# Patient Record
Sex: Male | Born: 1965 | Race: White | Hispanic: No | Marital: Married | Smoking: Never smoker
Health system: Southern US, Community
[De-identification: ages and names within clinical notes are randomized; demographics above are authoritative.]

---

## 2004-12-15 ENCOUNTER — Emergency Department: Payer: Self-pay | Admitting: Emergency Medicine

## 2014-02-18 ENCOUNTER — Emergency Department: Payer: Self-pay | Admitting: Emergency Medicine

## 2014-02-18 LAB — COMPREHENSIVE METABOLIC PANEL
ALK PHOS: 96 U/L
Albumin: 3.9 g/dL (ref 3.4–5.0)
Anion Gap: 5 — ABNORMAL LOW (ref 7–16)
BILIRUBIN TOTAL: 0.5 mg/dL (ref 0.2–1.0)
BUN: 10 mg/dL (ref 7–18)
CHLORIDE: 102 mmol/L (ref 98–107)
CREATININE: 0.91 mg/dL (ref 0.60–1.30)
Calcium, Total: 9.3 mg/dL (ref 8.5–10.1)
Co2: 30 mmol/L (ref 21–32)
EGFR (African American): >60
EGFR (Non-African Amer.): >60
Glucose: 105 mg/dL — ABNORMAL HIGH (ref 65–99)
OSMOLALITY: 273 (ref 275–301)
Potassium: 3.8 mmol/L (ref 3.5–5.1)
SGOT(AST): 25 U/L (ref 15–37)
SGPT (ALT): 36 U/L
Sodium: 137 mmol/L (ref 136–145)
Total Protein: 8.5 g/dL — ABNORMAL HIGH (ref 6.4–8.2)

## 2014-02-18 LAB — URINALYSIS, COMPLETE
Bacteria: NONE SEEN
Bilirubin,UR: NEGATIVE
GLUCOSE, UR: NEGATIVE mg/dL (ref 0–75)
Ketone: NEGATIVE
Leukocyte Esterase: NEGATIVE
NITRITE: NEGATIVE
PH: 5 (ref 4.5–8.0)
Protein: NEGATIVE
SPECIFIC GRAVITY: 1.016 (ref 1.003–1.030)
Squamous Epithelial: 1
WBC UR: <1 /HPF (ref 0–5)

## 2014-02-18 LAB — CBC
HCT: 43.8 % (ref 40.0–52.0)
HGB: 14.2 g/dL (ref 13.0–18.0)
MCH: 27.3 pg (ref 26.0–34.0)
MCHC: 32.5 g/dL (ref 32.0–36.0)
MCV: 84 fL (ref 80–100)
Platelet: 325 10*3/uL (ref 150–440)
RBC: 5.22 10*6/uL (ref 4.40–5.90)
RDW: 14 % (ref 11.5–14.5)
WBC: 12.4 10*3/uL — AB (ref 3.8–10.6)

## 2014-02-18 LAB — TROPONIN I: Troponin-I: <0.02 ng/mL

## 2014-02-18 LAB — LIPASE, BLOOD: Lipase: 87 U/L (ref 73–393)

## 2014-02-20 ENCOUNTER — Ambulatory Visit: Payer: Self-pay | Admitting: Urology

## 2014-03-03 ENCOUNTER — Ambulatory Visit: Payer: Self-pay | Admitting: Obstetrics and Gynecology

## 2014-03-09 ENCOUNTER — Ambulatory Visit: Payer: Self-pay | Admitting: Urology

## 2014-03-09 LAB — URINALYSIS, COMPLETE
BILIRUBIN, UR: NEGATIVE
Bacteria: NONE SEEN
GLUCOSE, UR: NEGATIVE mg/dL (ref 0–75)
KETONE: NEGATIVE
Nitrite: NEGATIVE
PH: 5 (ref 4.5–8.0)
Protein: 100
SPECIFIC GRAVITY: 1.018 (ref 1.003–1.030)
Squamous Epithelial: NONE SEEN
WBC UR: 34 /HPF (ref 0–5)

## 2014-03-09 LAB — BASIC METABOLIC PANEL
Anion Gap: 6 — ABNORMAL LOW (ref 7–16)
BUN: 15 mg/dL (ref 7–18)
CREATININE: 0.94 mg/dL (ref 0.60–1.30)
Calcium, Total: 9.3 mg/dL (ref 8.5–10.1)
Chloride: 102 mmol/L (ref 98–107)
Co2: 29 mmol/L (ref 21–32)
EGFR (African American): >60
EGFR (Non-African Amer.): >60
Glucose: 117 mg/dL — ABNORMAL HIGH (ref 65–99)
Osmolality: 276 (ref 275–301)
POTASSIUM: 4.2 mmol/L (ref 3.5–5.1)
Sodium: 137 mmol/L (ref 136–145)

## 2014-03-09 LAB — PROTIME-INR
INR: 1
Prothrombin Time: 12.9 secs (ref 11.5–14.7)

## 2014-03-09 LAB — APTT: Activated PTT: 31.5 secs (ref 23.6–35.9)

## 2014-03-10 LAB — URINE CULTURE

## 2014-03-11 ENCOUNTER — Ambulatory Visit: Payer: Self-pay | Admitting: Urology

## 2014-04-16 ENCOUNTER — Ambulatory Visit: Payer: Self-pay | Admitting: Urology

## 2014-10-31 NOTE — Op Note (Signed)
PATIENT NAME:  Eduardo Jimenez, Eduardo Jimenez MR#:  [id number] DATE OF BIRTH:  08/23/1965  DATE OF PROCEDURE:  02/20/2014  PREOPERATIVE DIAGNOSIS: Left ureteropelvic junction obstruction.   POSTOPERATIVE DIAGNOSIS: Left ureteropelvic junction obstruction.   PROCEDURE: Cystoscopy with retrograde stent.  SURGEON: Pearce Littlefield D. Edwyna ShellHart, DO.  ANESTHESIA: General.  DESCRIPTION OF PROCEDURE: With the patient sterilely prepped and draped and after an appropriate timeout, I do a left retrograde. I only see one ureteral orifice on the left side. So, I instrument this orifice, and do a retrograde. I see no partial duplication on the retrograde. There is a tightness at the UPJ. So, I am able to bypass this with a 0.036 Glidewire, and over the 0.036 Glidewire, I place the 28 cm, 6 French stent and easily goes up into the small pelvis. It is really hard to tell whether I am instrumented into hydronephrotic segment. However, I cannot see any duplication at this point. So, I am assuming that I bypassed the stone as it was tight where I put the wire and the stent. The stent is in good position by cystoscopy in the distal portion as it is in the bladder, I make sure it is curled in the bladder and curled up in the kidney.   The patient tolerated this procedure well. He is sent to recovery in satisfactory condition with 30 mL of 0.5% Marcaine in is bladder and a belladonna and opium suppository. The patient had instrumentation with a 5 JamaicaFrench open-ended catheter for the retrograde.    ____________________________ Caralyn Guileichard D. Edwyna ShellHart, DO rdh:NT D: 02/20/2014 11:30:00 ET T: 02/20/2014 13:48:03 ET JOB#: [id number]  cc: Caralyn Guileichard D. Edwyna ShellHart, DO, <Dictator> Kinta Martis D Kagan Mutchler DO ELECTRONICALLY SIGNED 02/20/2014 15:27

## 2014-10-31 NOTE — Op Note (Signed)
PATIENT NAME:  Eduardo Jimenez, Eduardo Jimenez MR#:  [id number] DATE OF BIRTH:  [birth year]  DATE OF PROCEDURE:  03/11/2014  PREOPERATIVE DIAGNOSES:  Left proximal ureteral stone, left ureteropelvic junction obstruction.    POSTOPERATIVE DIAGNOSES:  Left proximal ureteral stone, left ureteropelvic junction obstruction.    PROCEDURE PERFORMED:  Left ureteroscopy, laser lithotripsy, left ureteral stent exchange, interpretation of fluoroscopy less than 30 minutes.    ATTENDING SURGEON:  Claris GladdenAshley J. Suzanna Zahn, MD    ANESTHESIA:  General anesthesia.    ESTIMATED BLOOD LOSS:  Minimal.    DRAINS:  None.    COMPLICATIONS:  None.    SPECIMENS:  Stone (given to patient postoperatively).    INDICATION:  This is a 49 year old male with a history of nephrolithiasis who previously underwent left ureteral stent placement for a 3 mm left proximal ureteral stone at the level of a known left UPJ obstruction.  He returns today to the operating room for definitive management of his stone.  Risk and benefits of procedure were explained in detail to patient who agreed to proceed as planned.    PROCEDURE:  The patient was correctly identified in the preoperative holding area and informed consent was obtained.  He was brought to the operating suite and placed on the table in the supine position.  At this time, a universal time-out protocol was performed.  All team members were identified.  Venodyne boots were placed and he was administered 3 grams of IV Ancef in the preoperative period.  He was then placed under general anesthesia and repositioned in the dorsal-lithotomy position.  He was prepped and draped in the standard surgical fashion.    A rigid cystoscope was then introduced per urethra into the bladder without difficulty.  The bladder was surveilled.  There was no bladder pathology noted.  The left ureteral stent could be seen emanating from the left UO.  At this time, a stent grasper was used to grasp the tip of the coil  off the stent, which was brought out per urethral meatus.  The stent was then cannulated using a 0.038 sensor wire up to the level of the kidney confirmed under fluoroscopic guidance.  The stent was then removed leaving the wire in place.  A dual-lumen access sheath was advanced into the level of the distal ureter and a second sensor wire was introduced.  One wire was snapped in place used as a safety wire and the second was used as a working wire.    A flexible ureteroscope was then advanced over this level up to the level of the UPJ.  The UPJ was inspected and there was no evidence of stone.  At this point in time formal pyeloscopy was performed, and this revealed a small stone in the lower mid pole calyx.  The stone was quite small and grasped using a Zero Tip Nitinol basket and able to be brought out through the ureter without fragmentation.  The dual-lumen was again used to introduce a second wire, and the wire was placed up to the level of the renal pelvis.  A second stone fragment was then identified, this one significantly larger, and it was partially fragmented using a 200 micron laser fiber using the settings of 0.8 joules and 12 hertz.  Once fragmented, each small piece was grasped using a tipless Nitinol basket and able to be brought out through the ureter without difficulty.  The same procedure was used to reintroduce the scope up to the level of the renal pelvis,  which was carefully inspected including all calyces, both under direct visualization and under fluoroscopic guidance.  There were no remaining stone fragments throughout the entire kidney.    The scope was then removed.  The remaining safety wire was then backloaded over a rigid cystoscope and a 6 x 26 [place] double J ureteral stent was advanced to the level of the renal pelvis.  The wire was partially withdrawn until a coil was noted within the renal pelvis.  The wire was then fully withdrawn and a coil was noted within the bladder.     The bladder was then drained and removed.  The patient was then returned to the supine position, reversed from anesthesia and taken to the PACU in stable condition.  There were no complications in this case.    ____________________________ Claris Gladden, MD ajb:aw D: 03/11/2014 19:09:54 ET T: 03/12/2014 02:02:17 ET JOB#: [id number]  cc: Claris Gladden, MD, <Dictator> Claris Gladden MD ELECTRONICALLY SIGNED 04/19/2014 8:18

## 2014-12-28 ENCOUNTER — Other Ambulatory Visit: Payer: Self-pay | Admitting: Family Medicine

## 2014-12-28 DIAGNOSIS — N200 Calculus of kidney: Secondary | ICD-10-CM

## 2016-06-11 IMAGING — CT CT STONE STUDY
2 of 4 series · 16 of 46 positions shown, 18 images · non-contrast
Comparison: None.

CLINICAL DATA: Left flank pain with a history of kidney stones.

EXAM:
CT ABDOMEN AND PELVIS WITHOUT CONTRAST
TECHNIQUE: Multidetector CT imaging of the abdomen and pelvis was performed
following the standard protocol without IV contrast.

[Series 2: stone standard full · axial · 0.82mm/px · z∈[-545,-85]mm · 13 of 102 slices shown, 15 images]
[im 5/102  soft-tissue]
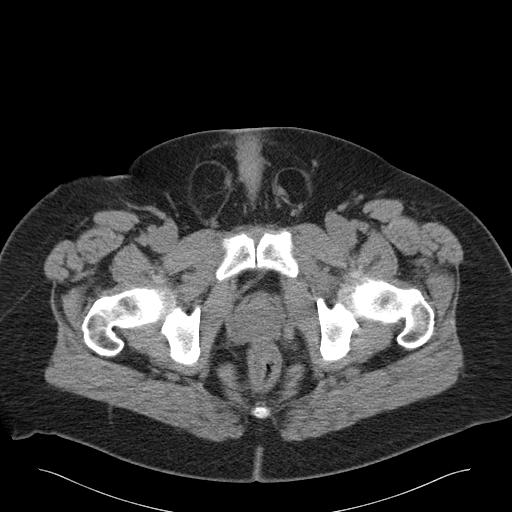
[im 5/102  bone]
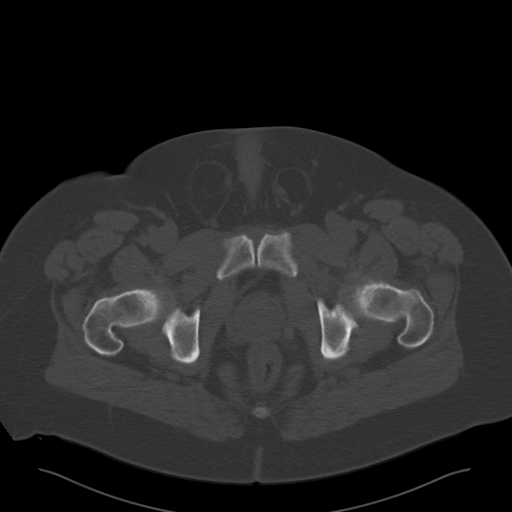
[im 13/102  soft-tissue]
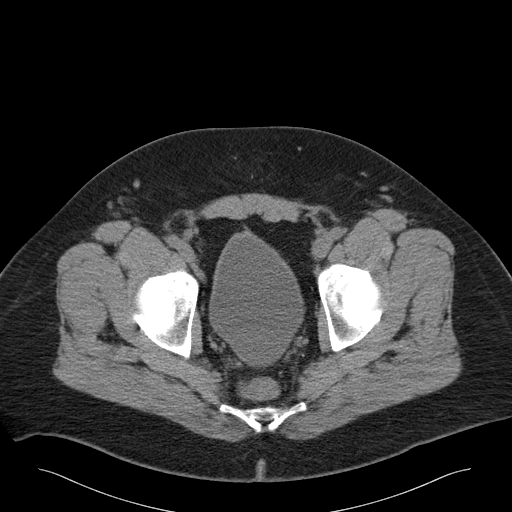
[im 22/102  soft-tissue]
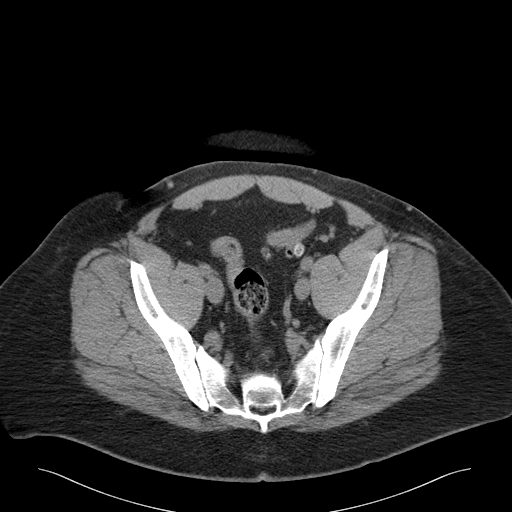
[im 30/102  soft-tissue]
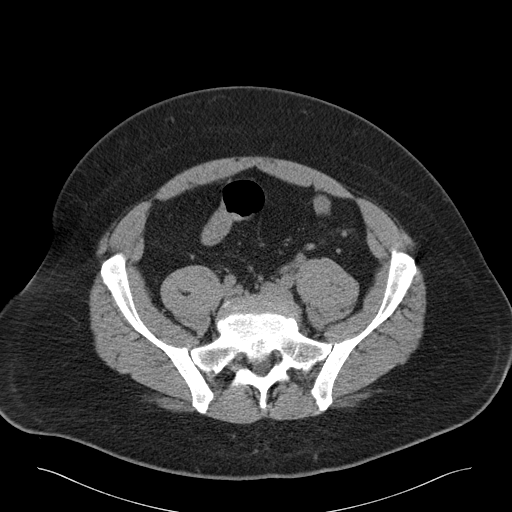
[im 34/102  soft-tissue]
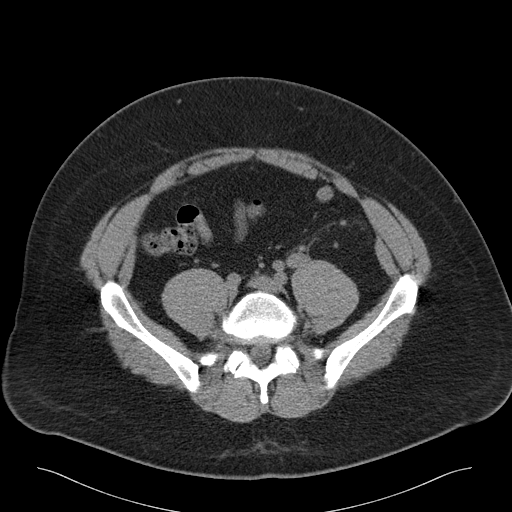
[im 43/102  soft-tissue]
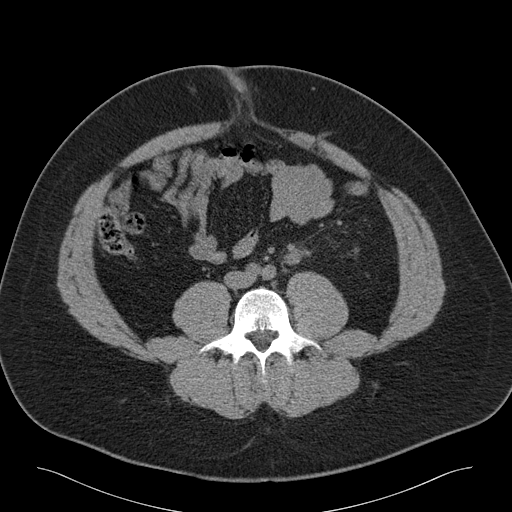
[im 51/102  soft-tissue]
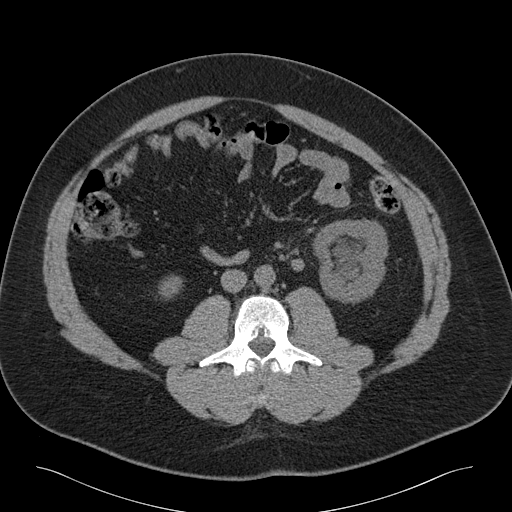
[im 59/102  soft-tissue]
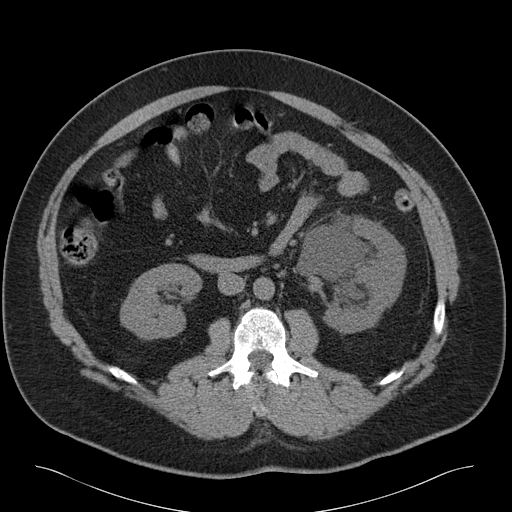
[im 68/102  soft-tissue]
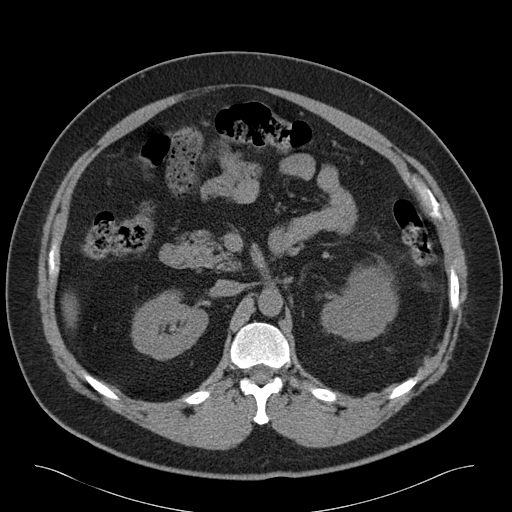
[im 68/102  bone]
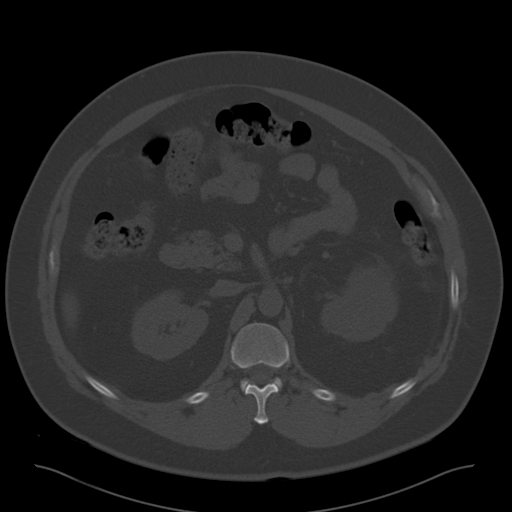
[im 72/102  soft-tissue]
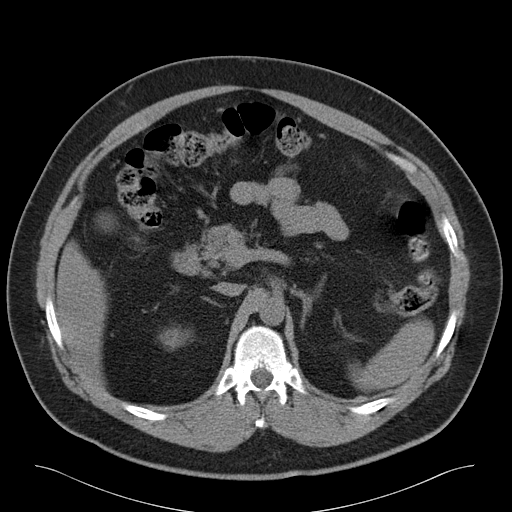
[im 80/102  soft-tissue]
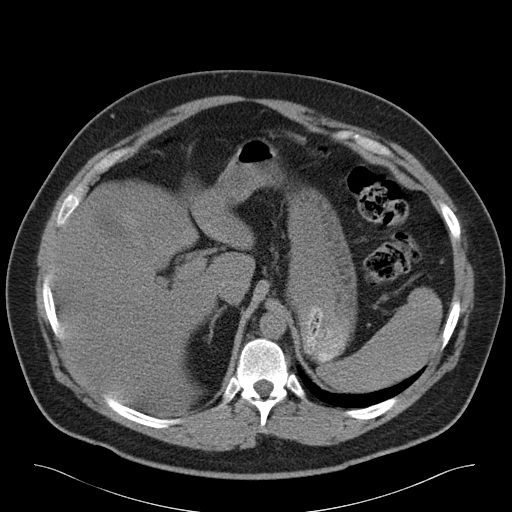
[im 89/102  soft-tissue]
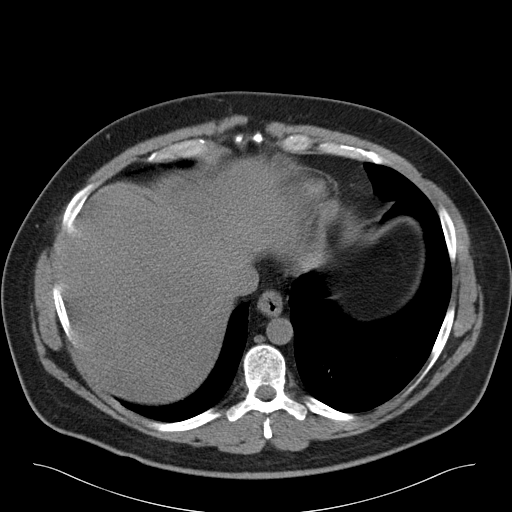
[im 97/102  soft-tissue]
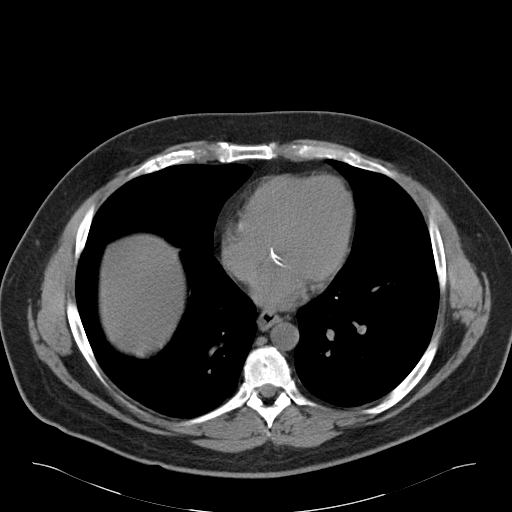

[Series 5: cor stone standard full · coronal · 0.88mm/px · 3 of 170 slices shown]
[im 57/170  soft-tissue]
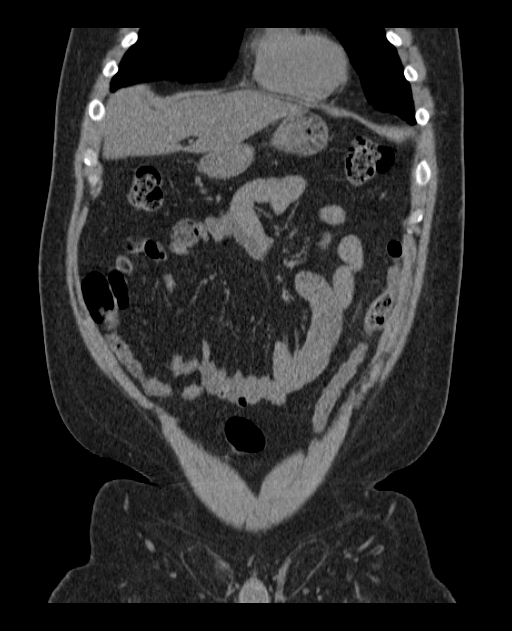
[im 76/170  soft-tissue]
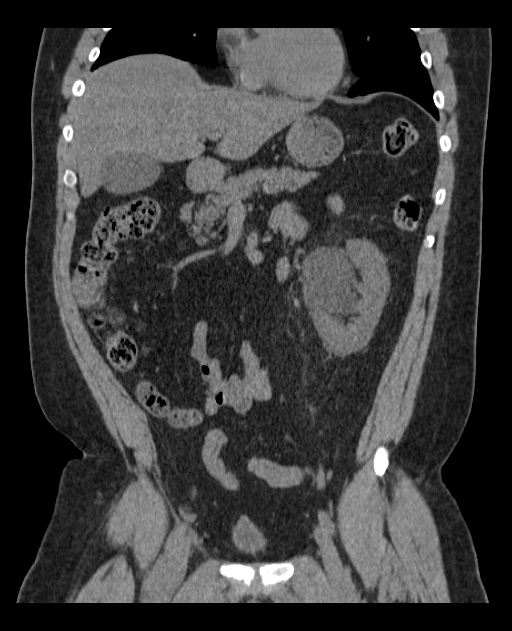
[im 94/170  soft-tissue]
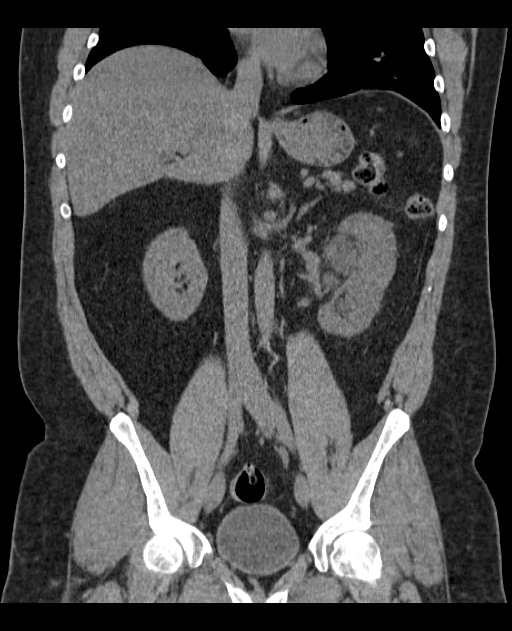

[16 of 46 positions shown; findings below may reference images not displayed]

FINDINGS: Lung bases show no acute findings. Heart size normal. No pericardial
or pleural effusion.

Liver appears slightly decreased in attenuation diffusely with areas
of peripheral sparing. Liver, gallbladder, adrenal glands and right
kidney are unremarkable.

There is left renal edema with left perinephric stranding and severe
left hydronephrosis secondary to a 3 mm stone in the proximal left
ureter. Abrupt decompression of the left ureter at the ureteral
pelvic junction. Left gonadal vein is tortuous.

Spleen, pancreas, stomach and bowel are unremarkable. Small
bilateral inguinal hernias contain fat. No pathologically enlarged
lymph nodes. No free fluid. No worrisome lytic or sclerotic lesions.
IMPRESSION: 1. 3 mm proximal left ureteral stone with superimposed obstruction
of likely underlying chronic left ureteropelvic junction
obstruction.
2. Hepatic steatosis.
3. Slightly engorged and tortuous left gonadal vein, of uncertain
significance.
4. Small bilateral inguinal hernias contain fat.

## 2016-08-07 IMAGING — US US RENAL KIDNEY
1 series · 14 of 24 positions shown · non-contrast
Comparison: 02/18/2014

CLINICAL DATA: Follow-up visit for kidney stone, previous ureteral
stent has been removed

EXAM:
RENAL/URINARY TRACT ULTRASOUND COMPLETE

[Series 1: us renal kidney · 0.31mm/px · 14 of 24 slices shown]
[im 1/24]
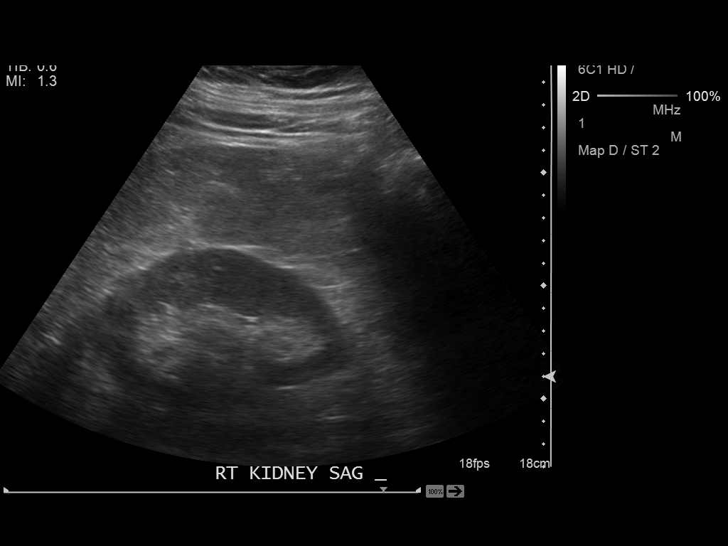
[im 3/24]
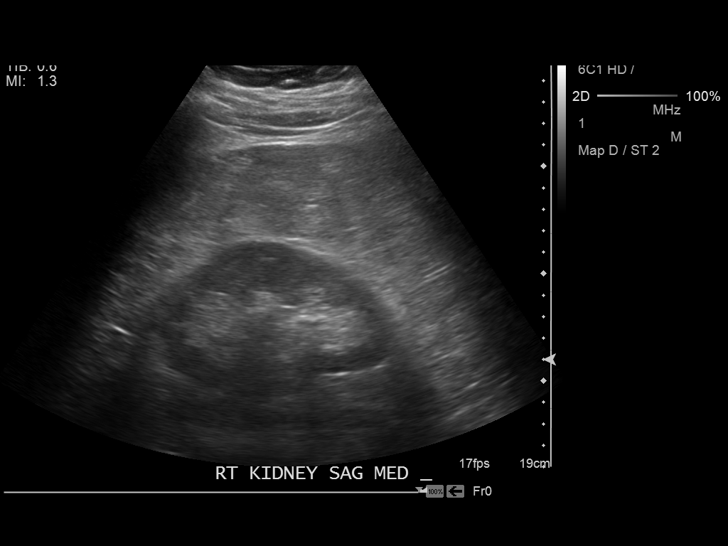
[im 5/24]
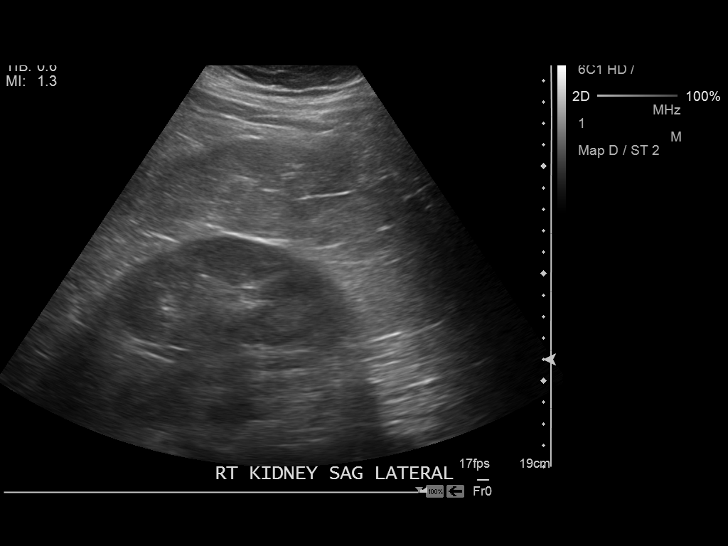
[im 7/24]
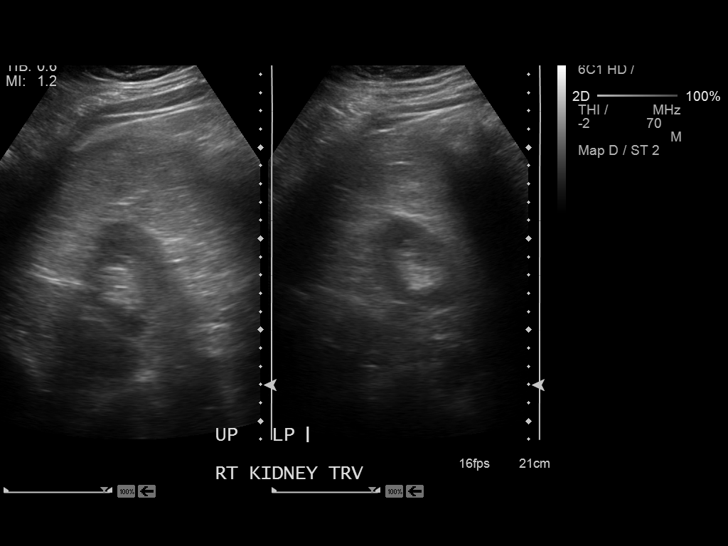
[im 8/24]
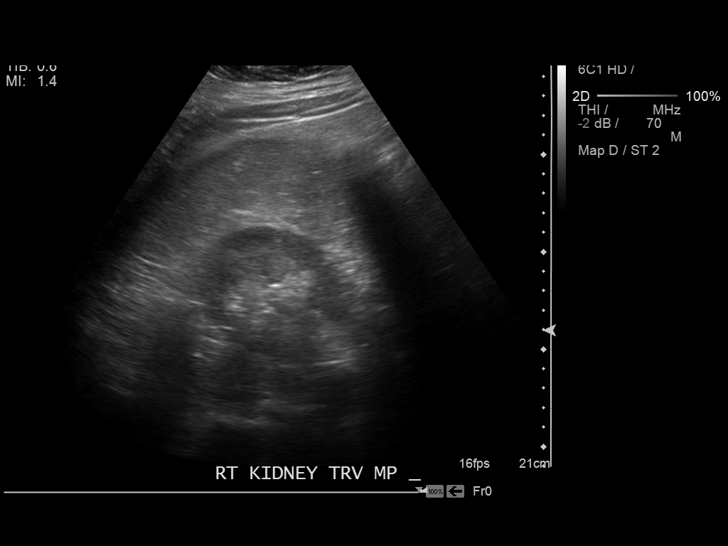
[im 10/24]
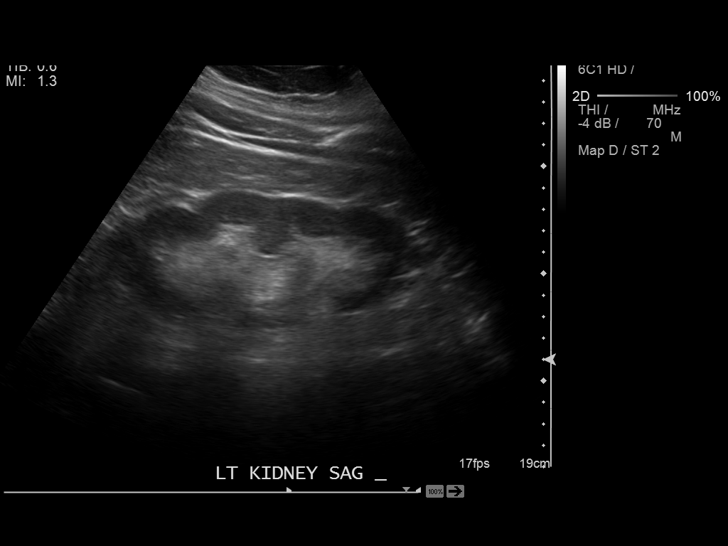
[im 12/24]
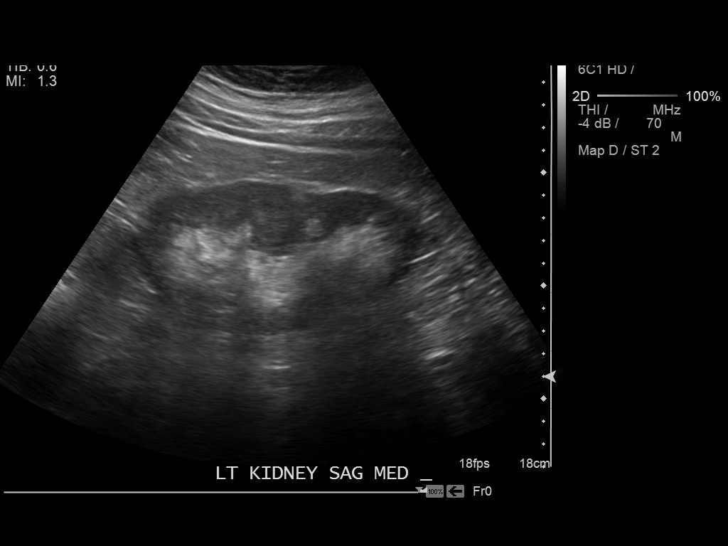
[im 13/24]
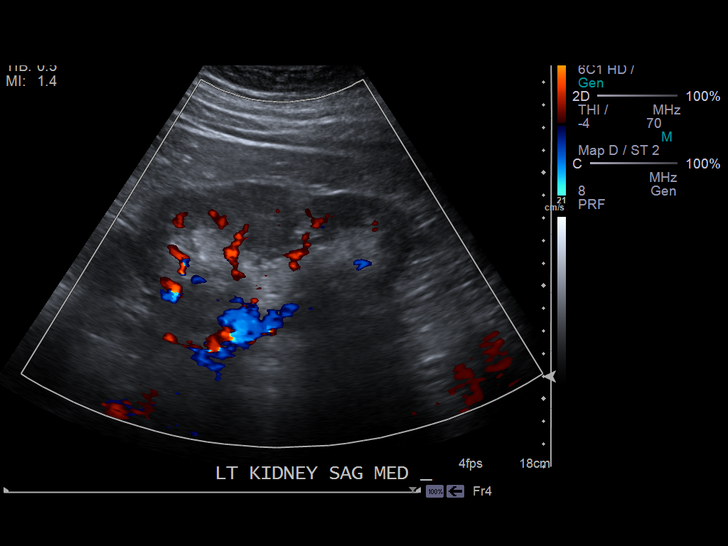
[im 15/24]
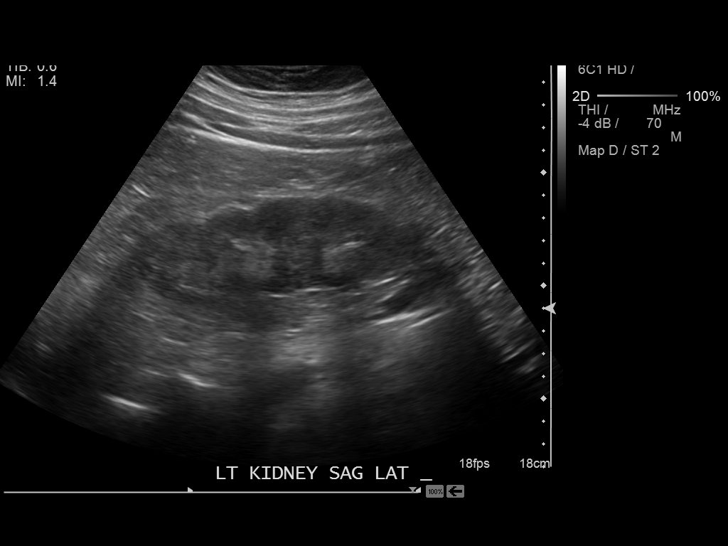
[im 17/24]
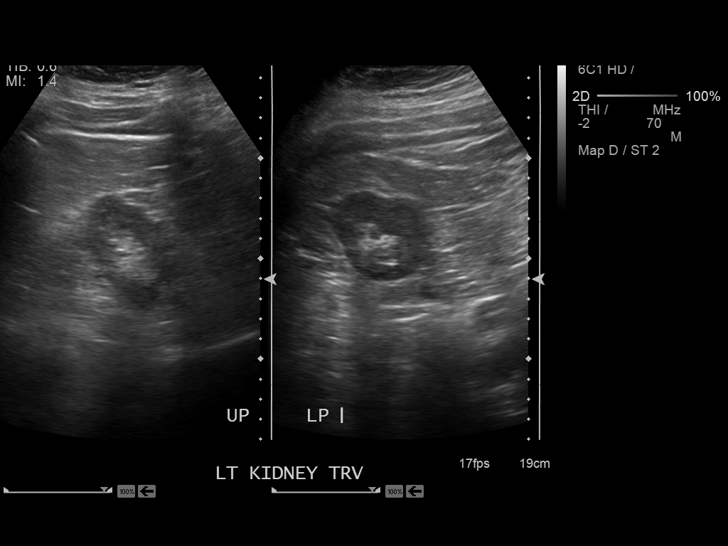
[im 19/24]
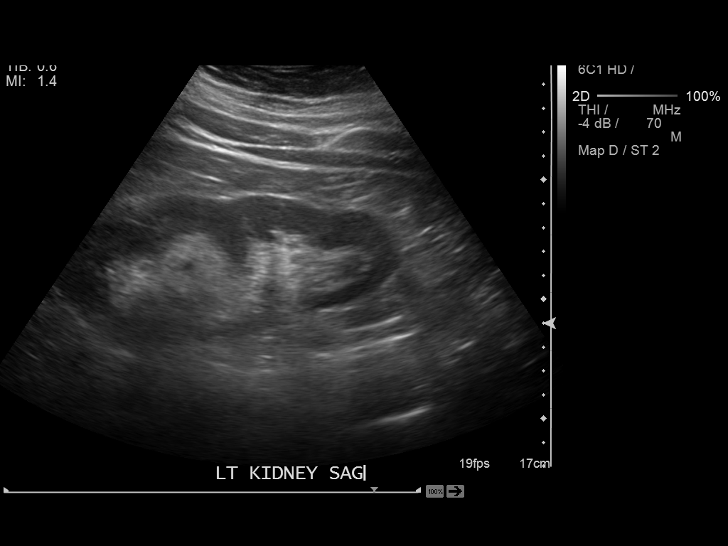
[im 20/24]
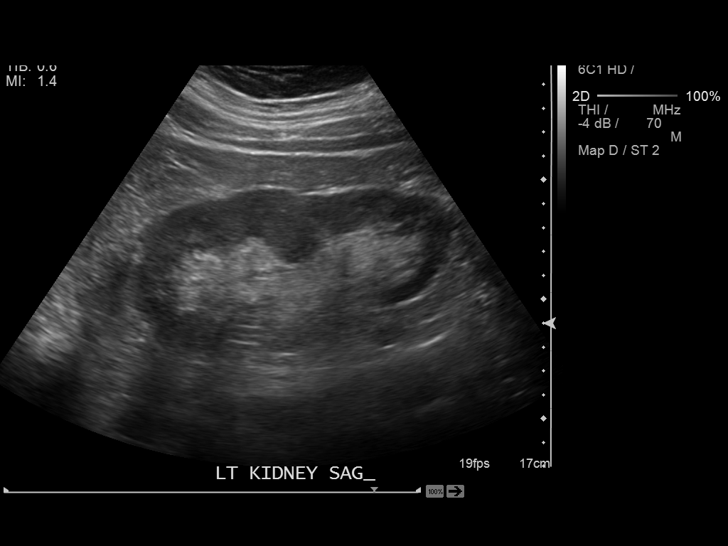
[im 22/24]
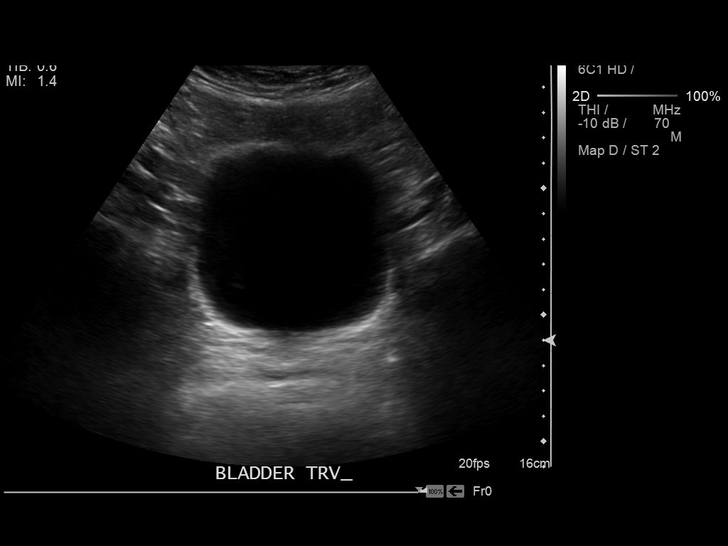
[im 24/24]
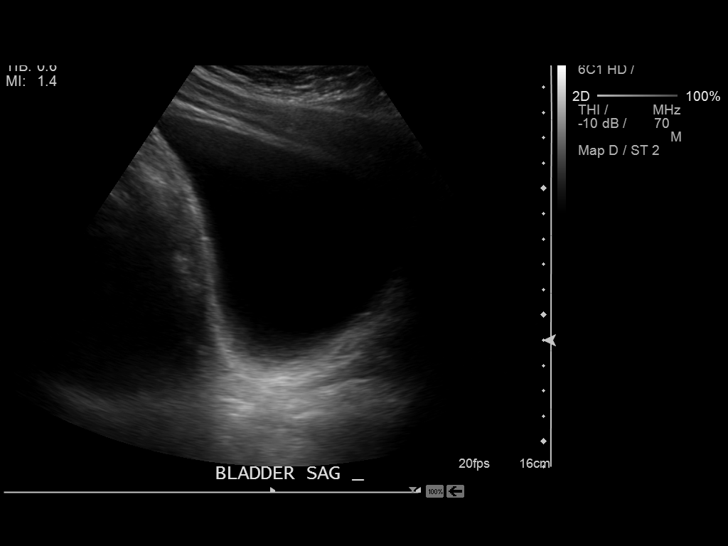

[14 of 24 positions shown; findings below may reference images not displayed]

FINDINGS: Right Kidney:

Length: 11.8 cm. Echogenicity within normal limits. No mass or
hydronephrosis visualized.

Left Kidney:

Length: 13.8 cm.. Echogenicity normal. No mass or hydronephrosis. No
stone identified. Mildly lobulated contour likely normal variant.

Bladder:

Appears normal for degree of bladder distention.
IMPRESSION: No acute abnormalities

## 2024-06-16 ENCOUNTER — Emergency Department

## 2024-06-16 ENCOUNTER — Emergency Department
Admission: EM | Admit: 2024-06-16 | Discharge: 2024-06-16 | Disposition: A | Discharge status: Home / Self Care | Attending: Emergency Medicine | Admitting: Emergency Medicine

## 2024-06-16 ENCOUNTER — Other Ambulatory Visit: Payer: Self-pay

## 2024-06-16 DIAGNOSIS — N201 Calculus of ureter: Secondary | ICD-10-CM

## 2024-06-16 LAB — URINALYSIS, ROUTINE W REFLEX MICROSCOPIC
Bacteria, UA: NONE SEEN
Bilirubin Urine: NEGATIVE
Glucose, UA: NEGATIVE mg/dL
Ketones, ur: NEGATIVE mg/dL
Leukocytes,Ua: NEGATIVE
Nitrite: NEGATIVE
Protein, ur: NEGATIVE mg/dL
Specific Gravity, Urine: 1.019 (ref 1.005–1.030)
pH: 5 (ref 5.0–8.0)

## 2024-06-16 LAB — COMPREHENSIVE METABOLIC PANEL WITH GFR
ALT: 22 U/L (ref 0–44)
AST: 32 U/L (ref 15–41)
Albumin: 4.5 g/dL (ref 3.5–5.0)
Alkaline Phosphatase: 89 U/L (ref 38–126)
Anion gap: 15 (ref 5–15)
BUN: 17 mg/dL (ref 6–20)
CO2: 23 mmol/L (ref 22–32)
Calcium: 9.2 mg/dL (ref 8.9–10.3)
Chloride: 100 mmol/L (ref 98–111)
Creatinine, Ser: 1.02 mg/dL (ref 0.61–1.24)
GFR, Estimated: >60 mL/min (ref >60)
Glucose, Bld: 139 mg/dL — ABNORMAL HIGH (ref 70–99)
Potassium: 4.5 mmol/L (ref 3.5–5.1)
Sodium: 138 mmol/L (ref 135–145)
Total Bilirubin: 0.4 mg/dL (ref 0.0–1.2)
Total Protein: 8.2 g/dL — ABNORMAL HIGH (ref 6.5–8.1)

## 2024-06-16 LAB — LIPASE, BLOOD: Lipase: 32 U/L (ref 11–51)

## 2024-06-16 LAB — CBC
HCT: 44 % (ref 39.0–52.0)
Hemoglobin: 14.4 g/dL (ref 13.0–17.0)
MCH: 27.6 pg (ref 26.0–34.0)
MCHC: 32.7 g/dL (ref 30.0–36.0)
MCV: 84.3 fL (ref 80.0–100.0)
Platelets: 300 K/uL (ref 150–400)
RBC: 5.22 MIL/uL (ref 4.22–5.81)
RDW: 13.3 % (ref 11.5–15.5)
WBC: 11.8 K/uL — ABNORMAL HIGH (ref 4.0–10.5)
nRBC: 0 % (ref 0.0–0.2)

## 2024-06-16 MED ORDER — TAMSULOSIN HCL 0.4 MG PO CAPS
0.4000 mg | ORAL_CAPSULE | Freq: Once | ORAL | Status: AC
  Administered 2024-06-16: 0.4 mg via ORAL
  Filled 2024-06-16: qty 1

## 2024-06-16 MED ORDER — KETOROLAC TROMETHAMINE 30 MG/ML IJ SOLN
30.0000 mg | Freq: Once | INTRAMUSCULAR | Status: AC
  Administered 2024-06-16: 30 mg via INTRAVENOUS
  Filled 2024-06-16: qty 1

## 2024-06-16 MED ORDER — ONDANSETRON HCL 4 MG/2ML IJ SOLN
4.0000 mg | Freq: Once | INTRAMUSCULAR | Status: AC
  Administered 2024-06-16: 4 mg via INTRAVENOUS
  Filled 2024-06-16: qty 2

## 2024-06-16 MED ORDER — TAMSULOSIN HCL 0.4 MG PO CAPS: 0.4000 mg | ORAL_CAPSULE | Freq: Every day | ORAL | 0 refills | Status: AC

## 2024-06-16 MED ORDER — OXYCODONE-ACETAMINOPHEN 5-325 MG PO TABS: 1.0000 | ORAL_TABLET | Freq: Three times a day (TID) | ORAL | 0 refills | Status: AC | PRN

## 2024-06-16 MED ORDER — MORPHINE SULFATE (PF) 2 MG/ML IV SOLN
2.0000 mg | Freq: Once | INTRAVENOUS | Status: AC
  Administered 2024-06-16: 2 mg via INTRAVENOUS
  Filled 2024-06-16: qty 1

## 2024-06-16 MED ORDER — SODIUM CHLORIDE 0.9 % IV BOLUS
1000.0000 mL | Freq: Once | INTRAVENOUS | Status: AC
  Administered 2024-06-16: 1000 mL via INTRAVENOUS

## 2024-06-16 MED ORDER — NAPROXEN 500 MG PO TABS: 500.0000 mg | ORAL_TABLET | Freq: Two times a day (BID) | ORAL | 0 refills | Status: AC

## 2024-06-16 MED ORDER — ONDANSETRON 4 MG PO TBDP: 4.0000 mg | ORAL_TABLET | Freq: Three times a day (TID) | ORAL | 0 refills | Status: AC | PRN

## 2024-06-16 NOTE — ED Provider Notes (Signed)
 [facility] Provider Note    Event Date/Time   First MD Initiated Contact with Patient 06/16/24 1748     (approximate)   History   Back Pain   HPI  Eduardo Jimenez is a 58 y.o. male  with a past medical history of  nephrolithiasis presents to the emergency department with sudden onset right flank pain that started this morning with N/V. He states he feels like he cannot get comfortable no matter what position he is in. Patient denies dysuria, increased urinary frequency, fever, chills, abdominal pain, chest pain, shortness of breath.   Wife is present in the room with him.  Physical Exam   Triage Vital Signs: ED Triage Vitals  Encounter Vitals Group     BP 06/16/24 1504 (!) 148/90     Girls Systolic BP Percentile --      Girls Diastolic BP Percentile --      Boys Systolic BP Percentile --      Boys Diastolic BP Percentile --      Pulse Rate 06/16/24 1504 98     Resp 06/16/24 1504 18     Temp 06/16/24 1504 98.1 F (36.7 C)     Temp Source 06/16/24 1504 Oral     SpO2 06/16/24 1504 98 %     Weight --      Height --      Head Circumference --      Peak Flow --      Pain Score 06/16/24 1502 6     Pain Loc --      Pain Education --      Exclude from Growth Chart --     Most recent vital signs: Vitals:   06/16/24 1800 06/16/24 1853  BP:  136/88  Pulse:  90  Resp:  18  Temp:  98.1 F (36.7 C)  SpO2: 100% 100%    General: Awake, in no acute distress. Appears stated age. CV: Good peripheral perfusion.  Respiratory:Normal respiratory effort.  No respiratory distress. CTAB. GI: Soft, non-distended, non-tender. No rebound or guarding. Negative Murphy's sign. Skin:Warm, dry, intact. No rashes, lesions, or ecchymosis. No cyanosis or pallor. Neurological: A&Ox4 to person, place, time, and situation.  No CVA tenderness b/l.  TTP along right flank.  ED Results / Procedures / Treatments   Labs (all labs ordered are listed, but only  abnormal results are displayed) Labs Reviewed  COMPREHENSIVE METABOLIC PANEL WITH GFR - Abnormal; Notable for the following components:      Result Value   Glucose, Bld 139 (*)    Total Protein 8.2 (*)    All other components within normal limits  CBC - Abnormal; Notable for the following components:   WBC 11.8 (*)    All other components within normal limits  URINALYSIS, ROUTINE W REFLEX MICROSCOPIC - Abnormal; Notable for the following components:   Color, Urine YELLOW (*)    APPearance CLEAR (*)    Hgb urine dipstick SMALL (*)    All other components within normal limits  LIPASE, BLOOD     EKG     RADIOLOGY CT renal stone study IMPRESSION: 1. Obstructing 4 mm right UPJ calculus, with mild right hydronephrosis and perinephric fat stranding. 2. Other punctate 2 mm nonobstructing right renal calculus. 3. Distal colonic diverticulosis without diverticulitis. 4.  Aortic Atherosclerosis (ICD10-I70.0).   PROCEDURES:  Critical Care performed: No   Procedures   MEDICATIONS ORDERED IN ED: Medications  sodium chloride  0.9 % bolus 1,000  mL (0 mLs Intravenous Stopped 06/16/24 2022)  ondansetron  (ZOFRAN ) injection 4 mg (4 mg Intravenous Given 06/16/24 1839)  morphine  (PF) 2 MG/ML injection 2 mg (2 mg Intravenous Given 06/16/24 1837)  tamsulosin  (FLOMAX ) capsule 0.4 mg (0.4 mg Oral Given 06/16/24 1845)  ketorolac  (TORADOL ) 30 MG/ML injection 30 mg (30 mg Intravenous Given 06/16/24 2008)     IMPRESSION / MDM / ASSESSMENT AND PLAN / ED COURSE  I reviewed the triage vital signs and the nursing notes.                              Differential diagnosis includes, but is not limited to, nephrolithiasis, urolithiasis, UTI, dehydration  Patient's presentation is most consistent with acute complicated illness / injury requiring diagnostic workup.  Patient is a 58 year old male with acute onset right flank pain that started today.  He is nontoxic-appearing on exam, without any  vomiting and is afebrile.  He reports he cannot get into a comfortable position at this time.  He does not have any abdominal tenderness on exam.  Labs were ordered in triage.  Urinalysis has small hemoglobin, but no nitrites, leukocytes or bacteria.  CBC with mildly elevated white blood cell count of 11.8 otherwise unremarkable.  CMP unremarkable.  Lipase unremarkable.  Patient given fluid bolus, tamsulosin , Zofran  and morphine  in the emergency department.  CT renal stone study showed obstructing 4 mm right UPJ calculus with some mild hydronephrosis and fat stranding as well as 1 nonobstructing 2 mm right nephrolithiasis.  Given the stone is less than 5 mm, I did administer Toradol .  Given prescriptions for naproxen , Zofran , tamsulosin  and Percocet sent to his pharmacy of choice. PDMP was reviewed prior to prescribing Percocet. Instructed him to follow-up with urology outpatient as well as his primary care provider.    The patient may return to the emergency department for any new, worsening, or concerning symptoms. Patient was given the opportunity to ask questions; all questions were answered. Emergency department return precautions were discussed with the patient.  Patient is in agreement to the treatment plan.  Patient is stable for discharge.   FINAL CLINICAL IMPRESSION(S) / ED DIAGNOSES   Final diagnoses:  Obstruction of right ureteropelvic junction (UPJ) due to stone     Rx / DC Orders   ED Discharge Orders          Ordered    oxyCODONE -acetaminophen  (PERCOCET) 5-325 MG tablet  Every 8 hours PRN        06/16/24 1956    naproxen  (NAPROSYN ) 500 MG tablet  2 times daily with meals        06/16/24 1956    ondansetron  (ZOFRAN -ODT) 4 MG disintegrating tablet  Every 8 hours PRN        06/16/24 1956    tamsulosin  (FLOMAX ) 0.4 MG CAPS capsule  Daily after supper        06/16/24 1956             Note:  This document was prepared using Dragon voice recognition software and may include  unintentional dictation errors.     Sheron Salm, PA-C 06/16/24 2131    Jacolyn Pae, MD 06/16/24 2208

## 2024-06-16 NOTE — Discharge Instructions (Addendum)
 You have been seen in the Emergency Department (ED) today for pain caused by kidney stones.  As we have discussed, please drink plenty of fluids.  Please make a follow up appointment with the physician(s) listed elsewhere in this documentation (urology - Dr. Twylla). Give his office a call to schedule an appointment.  You may take pain medication as needed but ONLY as prescribed.  Please also take your prescribed Flomax  daily and other medications listed.   Please see your primary doctor as soon as possible as stones may take 1-3 weeks to pass and you may require additional care or medications.  Do not drink alcohol, drive or participate in any other potentially dangerous activities while taking opiate pain medication as it may make you sleepy. Do not take this medication with any other sedating medications, either prescription or over-the-counter. If you were prescribed Percocet or Vicodin, do not take these with acetaminophen  (Tylenol ) as it is already contained within these medications.   Take Percocet as needed for severe pain.  This medication is an opiate (or narcotic) pain medication and can be habit forming.  Use it as little as possible to achieve adequate pain control.  Do not use or use it with extreme caution if you have a history of opiate abuse or dependence.  If you are on a pain contract with your primary care doctor or a pain specialist, be sure to let them know you were prescribed this medication today from the [facility] Emergency Department.  This medication is intended for your use only - do not give any to anyone else and keep it in a secure place where nobody else, especially children, have access to it.  It will also cause or worsen constipation, so you may want to consider taking an over-the-counter stool softener while you are taking this medication.  Return to the Emergency Department (ED) or call your doctor if you have any worsening pain, fever, painful urination, are  unable to urinate, or develop other symptoms that concern you.

## 2024-06-16 NOTE — ED Triage Notes (Addendum)
 Pt to ED via POV from home. Pt reports right sided back pain that started a few hours ago. Hx of kidney stones. Denies urinary symptoms. Pt reports N/V on the way here.
